# Patient Record
Sex: Female | Born: 1995
Health system: Southern US, Community
[De-identification: ages and names within clinical notes are randomized; demographics above are authoritative.]

## PROBLEM LIST (undated history)

## (undated) HISTORY — PX: VULVA SURGERY: SHX837

---

## 2020-06-12 DIAGNOSIS — Z23 Encounter for immunization: Secondary | ICD-10-CM | POA: Diagnosis not present

## 2020-07-11 ENCOUNTER — Emergency Department (HOSPITAL_COMMUNITY): Payer: BC Managed Care – PPO

## 2020-07-11 ENCOUNTER — Encounter (HOSPITAL_COMMUNITY): Payer: Self-pay

## 2020-07-11 ENCOUNTER — Emergency Department (HOSPITAL_COMMUNITY)
Admission: EM | Admit: 2020-07-11 | Discharge: 2020-07-12 | Disposition: A | Discharge status: Home / Self Care | Payer: BC Managed Care – PPO | Attending: Emergency Medicine | Admitting: Emergency Medicine

## 2020-07-11 ENCOUNTER — Other Ambulatory Visit: Payer: Self-pay

## 2020-07-11 DIAGNOSIS — M791 Myalgia, unspecified site: Secondary | ICD-10-CM | POA: Diagnosis not present

## 2020-07-11 DIAGNOSIS — Z7982 Long term (current) use of aspirin: Secondary | ICD-10-CM | POA: Insufficient documentation

## 2020-07-11 DIAGNOSIS — M25572 Pain in left ankle and joints of left foot: Secondary | ICD-10-CM | POA: Insufficient documentation

## 2020-07-11 DIAGNOSIS — S99922A Unspecified injury of left foot, initial encounter: Secondary | ICD-10-CM | POA: Diagnosis not present

## 2020-07-11 MED ORDER — NAPROXEN 375 MG PO TABS: 375.0000 mg | ORAL_TABLET | Freq: Two times a day (BID) | ORAL | 0 refills | Status: AC

## 2020-07-11 NOTE — ED Notes (Signed)
Patient declined x-ray stating she does not feel like she needs it.
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
## 2020-07-11 NOTE — Discharge Instructions (Addendum)
Your x rays were negative. Please follow up as soon as possible with your primary care doctor.  Return to the emergency department immediately if you develop any of the following symptoms: You have numbness, tingling, or weakness in the arms or legs. You develop severe headaches not relieved with medicine. You have severe neck pain, especially tenderness in the middle of the back of your neck. You have changes in bowel or bladder control. There is increasing pain in any area of the body. You have shortness of breath, light-headedness, dizziness, or fainting. You have chest pain. You feel sick to your stomach (nauseous), throw up (vomit), or sweat. You have increasing abdominal discomfort. There is blood in your urine, stool, or vomit. You have pain in your shoulder (shoulder strap areas). You feel your symptoms are getting worse.

## 2020-09-18 DIAGNOSIS — L700 Acne vulgaris: Secondary | ICD-10-CM | POA: Diagnosis not present

## 2020-09-18 DIAGNOSIS — D224 Melanocytic nevi of scalp and neck: Secondary | ICD-10-CM | POA: Diagnosis not present

## 2020-10-05 DIAGNOSIS — Z23 Encounter for immunization: Secondary | ICD-10-CM | POA: Diagnosis not present

## 2020-10-16 ENCOUNTER — Other Ambulatory Visit: Payer: Self-pay

## 2020-10-16 ENCOUNTER — Encounter: Payer: Self-pay | Admitting: Obstetrics and Gynecology

## 2020-10-16 ENCOUNTER — Ambulatory Visit: Payer: BC Managed Care – PPO | Admitting: Obstetrics and Gynecology

## 2020-10-16 ENCOUNTER — Other Ambulatory Visit (HOSPITAL_COMMUNITY)
Admission: RE | Admit: 2020-10-16 | Discharge: 2020-10-16 | Disposition: A | Discharge status: Home / Self Care | Payer: BC Managed Care – PPO | Source: Ambulatory Visit | Attending: Obstetrics and Gynecology | Admitting: Obstetrics and Gynecology

## 2020-10-16 VITALS — BP 116/62 | HR 68 | Ht 68.0 in | Wt 204.0 lb

## 2020-10-16 DIAGNOSIS — Z01419 Encounter for gynecological examination (general) (routine) without abnormal findings: Secondary | ICD-10-CM

## 2020-10-16 DIAGNOSIS — Z3044 Encounter for surveillance of vaginal ring hormonal contraceptive device: Secondary | ICD-10-CM

## 2020-10-16 DIAGNOSIS — Z113 Encounter for screening for infections with a predominantly sexual mode of transmission: Secondary | ICD-10-CM

## 2020-10-16 DIAGNOSIS — Z124 Encounter for screening for malignant neoplasm of cervix: Secondary | ICD-10-CM | POA: Diagnosis not present

## 2020-10-16 MED ORDER — ETONOGESTREL-ETHINYL ESTRADIOL 0.12-0.015 MG/24HR VA RING: 1.0000 | VAGINAL_RING | VAGINAL | 4 refills | Status: DC

## 2020-10-16 NOTE — Patient Instructions (Signed)

## 2020-10-16 NOTE — Progress Notes (Signed)
25 y.o. G0P0000 Single White or Caucasian Not Hispanic or Latino female here as a new patient for an annual exam. Patient would like to discuss getting back on the NuvaRing. Recently moved here from [place]. Period Cycle (Days): 28 Period Duration (Days): 4-5 Period Pattern: Regular Menstrual Flow: Moderate Menstrual Control: Tampon Menstrual Control Change Freq (Hours): 4 Dysmenorrhea: (!) Moderate Dysmenorrhea Symptoms: Cramping  She ran out of the nuvaring a month ago. Her cramps are worse of it. On it her cramps are mild.  Sexually active, same partner x 2 years. Using condoms.   Patient's last menstrual period was 09/29/2020.          Sexually active: Yes.    The current method of family planning is condoms most of the time.    Exercising: Yes.    weights Smoker:  no  Health Maintenance: Pap:  May 2021 per patient in [place] History of abnormal Pap:  no TDaP:  2021 Gardasil: completed series   reports that she has never smoked. She has never used smokeless tobacco. She reports current alcohol use. She reports that she does not use drugs. Drinks 2 drinks a week. 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 Lives with her partner.   History reviewed. No pertinent past medical history.  Past Surgical History:  Procedure Laterality Date  . VULVA SURGERY     Labiaplasty    Current Outpatient Medications  Medication Sig Dispense Refill  . Aspirin-Acetaminophen-Caffeine (EXCEDRIN MIGRAINE PO) Take 1 tablet by mouth daily as needed (migraine).    . naproxen (NAPROSYN) 375 MG tablet Take 1 tablet (375 mg total) by mouth 2 (two) times daily with a meal. 20 tablet 0  . etonogestrel-ethinyl estradiol (NUVARING) 0.12-0.015 MG/24HR vaginal ring Place 1 each vaginally every 28 (twenty-eight) days. Insert vaginally and leave in place for 3 consecutive weeks, then remove for 1 week. (Patient not taking: Reported on 10/16/2020)     No current facility-administered medications for this visit.     Family History  Problem Relation Age of Onset  . Thyroid cancer Mother   . Diabetes Father   . Cervical cancer Paternal Grandmother     Review of Systems  Constitutional: Negative.   HENT: Negative.   Eyes: Negative.   Respiratory: Negative.   Cardiovascular: Negative.   Gastrointestinal: Negative.   Endocrine: Negative.   Genitourinary: Negative.   Musculoskeletal: Negative.   Skin: Negative.   Allergic/Immunologic: Negative.   Neurological: Negative.   Hematological: Negative.   Psychiatric/Behavioral: Negative.     Exam:   BP 116/62 (BP Location: Left Arm, Patient Position: Sitting, Cuff Size: Normal)   Pulse 68   Ht 5\' 8"  (1.727 m)   Wt 204 lb (92.5 kg)   LMP 09/29/2020   BMI 31.02 kg/m   Weight change: @WEIGHTCHANGE @ Height:   Height: 5\' 8"  (172.7 cm)  Ht Readings from Last 3 Encounters:  10/16/20 5\' 8"  (1.727 m)    General appearance: alert, cooperative and appears stated age Head: Normocephalic, without obvious abnormality, atraumatic Neck: no adenopathy, supple, symmetrical, trachea midline and thyroid normal to inspection and palpation Lungs: clear to auscultation bilaterally Cardiovascular: regular rate and rhythm Breasts: normal appearance, no masses or tenderness Abdomen: soft, non-tender; non distended,  no masses,  no organomegaly Extremities: extremities normal, atraumatic, no cyanosis or edema Skin: Skin color, texture, turgor normal. No rashes or lesions Lymph nodes: Cervical, supraclavicular, and axillary nodes normal. No abnormal inguinal nodes palpated Neurologic: Grossly normal   Pelvic: External  genitalia:  no lesions              Urethra:  normal appearing urethra with no masses, tenderness or lesions              Bartholins and Skenes: normal                 Vagina: normal appearing vagina with normal color and discharge, no lesions              Cervix: no lesions               Bimanual Exam:  Uterus:  normal size, contour,  position, consistency, mobility, non-tender and anteverted              Adnexa: no mass, fullness, tenderness               Rectovaginal: Confirms               Anus:  normal sphincter tone, no lesions  Zenovia Jordan chaperoned for the exam.  1. Well woman exam Discussed breast self exam Discussed calcium and vit D intake Declines screening labs   2. Screening for cervical cancer Pap with GC/CT - Cytology - PAP  3. Screening examination for STD (sexually transmitted disease) Declines blood work - Cytology - PAP with GC/CT  4. Encounter for surveillance of vaginal ring hormonal contraceptive device Doing well with the nuvaring.  - etonogestrel-ethinyl estradiol (NUVARING) 0.12-0.015 MG/24HR vaginal ring; Place 1 each vaginally every 28 (twenty-eight) days. Insert vaginally and leave in place for 3 consecutive weeks, then remove for 1 week.  Dispense: 3 each; Refill: 4

## 2020-10-18 LAB — CYTOLOGY - PAP
Chlamydia: NEGATIVE
Comment: NEGATIVE
Comment: NORMAL
Diagnosis: NEGATIVE
Neisseria Gonorrhea: NEGATIVE

## 2021-04-22 DIAGNOSIS — D224 Melanocytic nevi of scalp and neck: Secondary | ICD-10-CM | POA: Diagnosis not present

## 2021-05-27 DIAGNOSIS — H15102 Unspecified episcleritis, left eye: Secondary | ICD-10-CM | POA: Diagnosis not present
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
## 2021-12-15 ENCOUNTER — Other Ambulatory Visit: Payer: Self-pay | Admitting: Obstetrics and Gynecology

## 2021-12-15 DIAGNOSIS — Z3044 Encounter for surveillance of vaginal ring hormonal contraceptive device: Secondary | ICD-10-CM

## 2022-01-15 ENCOUNTER — Other Ambulatory Visit: Payer: Self-pay

## 2022-01-15 DIAGNOSIS — Z3044 Encounter for surveillance of vaginal ring hormonal contraceptive device: Secondary | ICD-10-CM

## 2022-01-15 NOTE — Telephone Encounter (Signed)
AEX 10/16/20. ?AEX 02/10/22 ? ?Spoke with patient and let her know she is past due for AEX and needs to schedule visit and then I will forward refill request to her MD.  She is requesting her Nuvaring. ? ?

## 2022-01-16 MED ORDER — ETONOGESTREL-ETHINYL ESTRADIOL 0.12-0.015 MG/24HR VA RING: 1.0000 | VAGINAL_RING | VAGINAL | 0 refills | Status: DC

## 2022-01-28 NOTE — Progress Notes (Deleted)
26 y.o. G0P0000 Single White or Caucasian Not Hispanic or Latino female here for annual exam.      No LMP recorded.          Sexually active: {yes no:314532}  The current method of family planning is {contraception:315051}.    Exercising: {yes no:314532}  {types:19826} Smoker:  {YES NO:22349}  Health Maintenance: Pap:  10/16/20 WNL HR HPV Neg History of abnormal Pap:  no MMG:  none  BMD:   none  Colonoscopy: none  TDaP:  2021 Gardasil: complete    reports that she has never smoked. She has never used smokeless tobacco. She reports current alcohol use. She reports that she does not use drugs.  No past medical history on file.  Past Surgical History:  Procedure Laterality Date   VULVA SURGERY     Labiaplasty    Current Outpatient Medications  Medication Sig Dispense Refill   Aspirin-Acetaminophen-Caffeine (EXCEDRIN MIGRAINE PO) Take 1 tablet by mouth daily as needed (migraine).     etonogestrel-ethinyl estradiol (NUVARING) 0.12-0.015 MG/24HR vaginal ring Place 1 each vaginally every 28 (twenty-eight) days. Insert vaginally and leave in place for 3 consecutive weeks, then remove for 1 week. 3 each 0   naproxen (NAPROSYN) 375 MG tablet Take 1 tablet (375 mg total) by mouth 2 (two) times daily with a meal. 20 tablet 0   No current facility-administered medications for this visit.    Family History  Problem Relation Age of Onset   Thyroid cancer Mother    Diabetes Father    Cervical cancer Paternal Grandmother     Review of Systems  Exam:   There were no vitals taken for this visit.  Weight change: @WEIGHTCHANGE @ Height:      Ht Readings from Last 3 Encounters:  10/16/20 5\' 8"  (1.727 m)    General appearance: alert, cooperative and appears stated age Head: Normocephalic, without obvious abnormality, atraumatic Neck: no adenopathy, supple, symmetrical, trachea midline and thyroid {CHL AMB PHY EX THYROID NORM [phone]::"normal to inspection and  palpation"} Lungs: clear to auscultation bilaterally Cardiovascular: regular rate and rhythm Breasts: {Exam; breast:13139::"normal appearance, no masses or tenderness"} Abdomen: soft, non-tender; non distended,  no masses,  no organomegaly Extremities: extremities normal, atraumatic, no cyanosis or edema Skin: Skin color, texture, turgor normal. No rashes or lesions Lymph nodes: Cervical, supraclavicular, and axillary nodes normal. No abnormal inguinal nodes palpated Neurologic: Grossly normal   Pelvic: External genitalia:  no lesions              Urethra:  normal appearing urethra with no masses, tenderness or lesions              Bartholins and Skenes: normal                 Vagina: normal appearing vagina with normal color and discharge, no lesions              Cervix: {CHL AMB PHY EX CERVIX NORM DEFAULT:[phone]::"no lesions"}               Bimanual Exam:  Uterus:  {CHL AMB PHY EX UTERUS NORM DEFAULT:[phone]::"normal size, contour, position, consistency, mobility, non-tender"}              Adnexa: {CHL AMB PHY EX ADNEXA NO MASS DEFAULT:[phone]::"no mass, fullness, tenderness"}               Rectovaginal: Confirms               Anus:  normal sphincter tone,  no lesions  *** chaperoned for the exam.  A:  Well Woman with normal exam  P:

## 2022-02-10 ENCOUNTER — Ambulatory Visit: Payer: BC Managed Care – PPO | Admitting: Obstetrics and Gynecology

## 2022-02-24 NOTE — Progress Notes (Deleted)
26 y.o. G0P0000 Single White or Caucasian Not Hispanic or Latino female here for annual exam.      No LMP recorded.          Sexually active: Yes.    The current method of family planning is NuvaRing vaginal inserts.    Exercising: {yes no:314532}  {types:19826} Smoker:  no  Health Maintenance: Pap: 10/16/20-WNL, 01/2020-per pt in [place] History of abnormal Pap: no TDaP: 2021  Gardasil: yes, completed.   reports that she has never smoked. She has never used smokeless tobacco. She reports current alcohol use. She reports that she does not use drugs.  No past medical history on file.  Past Surgical History:  Procedure Laterality Date   VULVA SURGERY     Labiaplasty    Current Outpatient Medications  Medication Sig Dispense Refill   Aspirin-Acetaminophen-Caffeine (EXCEDRIN MIGRAINE PO) Take 1 tablet by mouth daily as needed (migraine).     etonogestrel-ethinyl estradiol (NUVARING) 0.12-0.015 MG/24HR vaginal ring Place 1 each vaginally every 28 (twenty-eight) days. Insert vaginally and leave in place for 3 consecutive weeks, then remove for 1 week. 3 each 0   naproxen (NAPROSYN) 375 MG tablet Take 1 tablet (375 mg total) by mouth 2 (two) times daily with a meal. 20 tablet 0   No current facility-administered medications for this visit.    Family History  Problem Relation Age of Onset   Thyroid cancer Mother    Diabetes Father    Cervical cancer Paternal Grandmother     Review of Systems  Exam:   There were no vitals taken for this visit.  Weight change: @WEIGHTCHANGE @ Height:      Ht Readings from Last 3 Encounters:  10/16/20 5\' 8"  (1.727 m)    General appearance: alert, cooperative and appears stated age Head: Normocephalic, without obvious abnormality, atraumatic Neck: no adenopathy, supple, symmetrical, trachea midline and thyroid {CHL AMB PHY EX THYROID NORM DEFAULT:[phone]::"normal to inspection and palpation"} Lungs: clear to auscultation  bilaterally Cardiovascular: regular rate and rhythm Breasts: {Exam; breast:13139::"normal appearance, no masses or tenderness"} Abdomen: soft, non-tender; non distended,  no masses,  no organomegaly Extremities: extremities normal, atraumatic, no cyanosis or edema Skin: Skin color, texture, turgor normal. No rashes or lesions Lymph nodes: Cervical, supraclavicular, and axillary nodes normal. No abnormal inguinal nodes palpated Neurologic: Grossly normal   Pelvic: External genitalia:  no lesions              Urethra:  normal appearing urethra with no masses, tenderness or lesions              Bartholins and Skenes: normal                 Vagina: normal appearing vagina with normal color and discharge, no lesions              Cervix: {CHL AMB PHY EX CERVIX NORM DEFAULT:[phone]::"no lesions"}               Bimanual Exam:  Uterus:  {CHL AMB PHY EX UTERUS NORM [phone]::"normal size, contour, position, consistency, mobility, non-tender"}              Adnexa: {CHL AMB PHY EX ADNEXA NO MASS DEFAULT:[phone]::"no mass, fullness, tenderness"}               Rectovaginal: Confirms               Anus:  normal sphincter tone, no lesions  *** chaperoned for the exam.  A:  Well  Woman with normal exam  P:

## 2022-03-07 ENCOUNTER — Ambulatory Visit: Payer: BC Managed Care – PPO | Admitting: Obstetrics and Gynecology

## 2022-04-07 ENCOUNTER — Other Ambulatory Visit: Payer: Self-pay | Admitting: Obstetrics and Gynecology

## 2022-04-07 DIAGNOSIS — Z3044 Encounter for surveillance of vaginal ring hormonal contraceptive device: Secondary | ICD-10-CM

## 2022-05-13 ENCOUNTER — Encounter: Payer: Self-pay | Admitting: Obstetrics and Gynecology

## 2022-05-13 ENCOUNTER — Ambulatory Visit (INDEPENDENT_AMBULATORY_CARE_PROVIDER_SITE_OTHER): Payer: BC Managed Care – PPO | Admitting: Obstetrics and Gynecology

## 2022-05-13 VITALS — BP 126/74 | HR 88 | Ht 68.0 in | Wt 207.0 lb

## 2022-05-13 DIAGNOSIS — Z01419 Encounter for gynecological examination (general) (routine) without abnormal findings: Secondary | ICD-10-CM

## 2022-05-13 DIAGNOSIS — N946 Dysmenorrhea, unspecified: Secondary | ICD-10-CM

## 2022-05-13 DIAGNOSIS — Z113 Encounter for screening for infections with a predominantly sexual mode of transmission: Secondary | ICD-10-CM

## 2022-05-13 DIAGNOSIS — Z3044 Encounter for surveillance of vaginal ring hormonal contraceptive device: Secondary | ICD-10-CM

## 2022-05-13 MED ORDER — IBUPROFEN 800 MG PO TABS: 800.0000 mg | ORAL_TABLET | Freq: Three times a day (TID) | ORAL | 2 refills | Status: AC | PRN

## 2022-05-13 MED ORDER — ETONOGESTREL-ETHINYL ESTRADIOL 0.12-0.015 MG/24HR VA RING: 1.0000 | VAGINAL_RING | VAGINAL | 3 refills | Status: AC

## 2022-05-13 NOTE — Patient Instructions (Signed)

## 2022-05-13 NOTE — Progress Notes (Signed)
26 y.o. G0P0000 Single White or Caucasian Not Hispanic or Latino female here for annual exam.   Using the nuvaring. Cycles q month x 6 days. Saturates a super tampon in 4-5 hours. No BTB. Cramps are bad, takes motrin which helps.  Sexually active, same partner x 10 months. No dyspareunia. Using condoms.     Patient's last menstrual period was 04/27/2022.          Sexually active: Yes.    The current method of family planning is condoms most of the time and NuvaRing vaginal inserts.    Exercising:    weights 4-5 times a week.  Smoker:  No.  Health Maintenance: Pap:  10/16/20 WNL,  May 2021 per patient in [place] History of abnormal Pap:  no MMG:  none  BMD:   none  Colonoscopy: none  TDaP:  2021 per patient  Gardasil: complete per patient    reports that she has never smoked. She has never used smokeless tobacco. She reports current alcohol use. She reports that she does not use drugs. Drinks 3-4 beers a week. 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   History reviewed. No pertinent past medical history.  Past Surgical History:  Procedure Laterality Date   VULVA SURGERY     Labiaplasty    Current Outpatient Medications  Medication Sig Dispense Refill   Aspirin-Acetaminophen-Caffeine (EXCEDRIN MIGRAINE PO) Take 1 tablet by mouth daily as needed (migraine).     etonogestrel-ethinyl estradiol (NUVARING) 0.12-0.015 MG/24HR vaginal ring Place 1 each vaginally every 28 (twenty-eight) days. Insert vaginally and leave in place for 3 consecutive weeks, then remove for 1 week. 3 each 0   naproxen (NAPROSYN) 375 MG tablet Take 1 tablet (375 mg total) by mouth 2 (two) times daily with a meal. 20 tablet 0   ferrous sulfate 325 (65 FE) MG EC tablet Take by mouth. (Patient not taking: Reported on 05/13/2022)     No current facility-administered medications for this visit.    Family History  Problem Relation Age of Onset   Thyroid cancer Mother    Diabetes Father    Cervical cancer Paternal  Grandmother     Review of Systems  All other systems reviewed and are negative.   Exam:   BP 126/74   Pulse 88   Ht 5\' 8"  (1.727 m)   Wt 207 lb (93.9 kg)   LMP 04/27/2022   SpO2 100%   BMI 31.47 kg/m   Weight change: @WEIGHTCHANGE @ Height:   Height: 5\' 8"  (172.7 cm)  Ht Readings from Last 3 Encounters:  05/13/22 5\' 8"  (1.727 m)  10/16/20 5\' 8"  (1.727 m)    General appearance: alert, cooperative and appears stated age Head: Normocephalic, without obvious abnormality, atraumatic Neck: no adenopathy, supple, symmetrical, trachea midline and thyroid normal to inspection and palpation Lungs: clear to auscultation bilaterally Cardiovascular: regular rate and rhythm Breasts: normal appearance, no masses or tenderness Abdomen: soft, non-tender; non distended,  no masses,  no organomegaly Extremities: extremities normal, atraumatic, no cyanosis or edema Skin: Skin color, texture, turgor normal. No rashes or lesions Lymph nodes: Cervical, supraclavicular, and axillary nodes normal. No abnormal inguinal nodes palpated Neurologic: Grossly normal   Pelvic: External genitalia:  no lesions              Urethra:  normal appearing urethra with no masses, tenderness or lesions              Bartholins and Skenes: normal  Vagina: normal appearing vagina with normal color and discharge, no lesions              Cervix: no lesions               Bimanual Exam:  Uterus:  normal size, contour, position, consistency, mobility, non-tender and anteverted              Adnexa: no mass, fullness, tenderness               Rectovaginal: Confirms               Anus:  normal sphincter tone, no lesions  Carolynn Serve, CMA chaperoned for the exam.  1. Well woman exam Discussed breast self exam Discussed calcium and vit D intake Labs with primary  2. Encounter for surveillance of vaginal ring hormonal contraceptive device - etonogestrel-ethinyl estradiol (NUVARING) 0.12-0.015 MG/24HR  vaginal ring; Place 1 each vaginally every 28 (twenty-eight) days. Insert vaginally and leave in place for 3 consecutive weeks, then remove for 1 week.  Dispense: 3 each; Refill: 3  3. Dysmenorrhea - ibuprofen (ADVIL) 800 MG tablet; Take 1 tablet (800 mg total) by mouth every 8 (eight) hours as needed.  Dispense: 30 tablet; Refill: 2  4. Screening examination for STD (sexually transmitted disease) - SURESWAB CT/NG/T. vaginalis - RPR - HIV Antibody (routine testing w rflx) - Hepatitis C antibody

## 2022-05-14 LAB — SURESWAB CT/NG/T. VAGINALIS
C. trachomatis RNA, TMA: NOT DETECTED
N. gonorrhoeae RNA, TMA: NOT DETECTED
Trichomonas vaginalis RNA: NOT DETECTED

## 2022-05-15 LAB — RPR: RPR Ser Ql: NONREACTIVE

## 2022-05-15 LAB — HIV ANTIBODY (ROUTINE TESTING W REFLEX): HIV 1&2 Ab, 4th Generation: NONREACTIVE

## 2022-05-15 LAB — HEPATITIS C ANTIBODY: Hepatitis C Ab: NONREACTIVE
